# Patient Record
Sex: Female | Born: 1995 | Race: White | Hispanic: No | Marital: Married | Smoking: Never smoker
Health system: Southern US, Community
[De-identification: ages and names within clinical notes are randomized; demographics above are authoritative.]

## PROBLEM LIST (undated history)

## (undated) DIAGNOSIS — J45909 Unspecified asthma, uncomplicated: Secondary | ICD-10-CM

## (undated) DIAGNOSIS — R519 Headache, unspecified: Secondary | ICD-10-CM

## (undated) DIAGNOSIS — R51 Headache: Secondary | ICD-10-CM

## (undated) DIAGNOSIS — T7840XA Allergy, unspecified, initial encounter: Secondary | ICD-10-CM

## (undated) DIAGNOSIS — B019 Varicella without complication: Secondary | ICD-10-CM

## (undated) HISTORY — DX: Allergy, unspecified, initial encounter: T78.40XA

## (undated) HISTORY — DX: Varicella without complication: B01.9

## (undated) HISTORY — DX: Headache: R51

## (undated) HISTORY — DX: Unspecified asthma, uncomplicated: J45.909

## (undated) HISTORY — DX: Headache, unspecified: R51.9

## (undated) HISTORY — PX: NO PAST SURGERIES: SHX2092

---
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
## 2017-07-27 LAB — HM PAP SMEAR: HM Pap smear: NEGATIVE

## 2018-09-30 ENCOUNTER — Encounter: Payer: Self-pay | Admitting: Primary Care

## 2020-10-03 LAB — CBC AND DIFFERENTIAL: Hemoglobin: 12.6 (ref 12.0–16.0)

## 2020-10-06 LAB — HM PAP SMEAR

## 2020-11-20 ENCOUNTER — Encounter: Payer: Self-pay | Admitting: Gynecology

## 2020-11-26 ENCOUNTER — Encounter: Payer: Self-pay | Admitting: Primary Care

## 2021-11-03 ENCOUNTER — Other Ambulatory Visit: Payer: Self-pay | Admitting: Physician Assistant

## 2021-11-03 DIAGNOSIS — R7989 Other specified abnormal findings of blood chemistry: Secondary | ICD-10-CM

## 2021-11-03 DIAGNOSIS — R112 Nausea with vomiting, unspecified: Secondary | ICD-10-CM

## 2021-11-05 ENCOUNTER — Ambulatory Visit
Admission: RE | Admit: 2021-11-05 | Discharge: 2021-11-05 | Disposition: A | Discharge status: Home / Self Care | Payer: No Typology Code available for payment source | Source: Ambulatory Visit | Attending: Physician Assistant | Admitting: Physician Assistant

## 2021-11-05 DIAGNOSIS — R112 Nausea with vomiting, unspecified: Secondary | ICD-10-CM

## 2021-11-05 DIAGNOSIS — R7989 Other specified abnormal findings of blood chemistry: Secondary | ICD-10-CM

## 2023-04-05 LAB — OB RESULTS CONSOLE RUBELLA ANTIBODY, IGM: Rubella: IMMUNE

## 2023-04-05 LAB — OB RESULTS CONSOLE GC/CHLAMYDIA
Chlamydia: NEGATIVE
Neisseria Gonorrhea: NEGATIVE

## 2023-04-05 LAB — OB RESULTS CONSOLE RPR: RPR: NONREACTIVE

## 2023-04-05 LAB — OB RESULTS CONSOLE HEPATITIS B SURFACE ANTIGEN: Hepatitis B Surface Ag: NEGATIVE

## 2023-04-05 LAB — HEPATITIS C ANTIBODY: HCV Ab: NEGATIVE

## 2023-04-05 LAB — OB RESULTS CONSOLE HIV ANTIBODY (ROUTINE TESTING): HIV: NONREACTIVE

## 2023-04-14 NOTE — L&D Delivery Note (Addendum)
 DELIVERY NOTE  Pt complete and at +2 station with urge to push. Epidural controlling pain. Pt pushed and delivered a viable female infant in LOA position. Anterior and posterior shoulders spontaneously delivered with next two pushes; body easily followed next. Infant placed on mothers abdomen and bulb suction of mouth and nose performed. Cord was then clamped and cut by MD. Cord blood obtained, 3VC. Initial weak cry and poor color so baby stimulated at warmer and had a quick improvement in both; brought back to mother. Placenta then delivered at 0349 intact. Fundal massage performed and pitocin  per protocol. Fundus firm. The following lacerations were noted: NONE, EBL 250cc. Mother and baby stable. Counts correct   Infant time: 26 Gender: female, DESIRES CIRC Placenta time: 0349 Apgars: 7/9 Weight: pending skin-to-skin  Continue magnesium  sulfate for next 24hrs. Labetalol  200mg  BID started given BP and elevated HR

## 2023-10-25 ENCOUNTER — Encounter (HOSPITAL_COMMUNITY): Payer: Self-pay | Admitting: Obstetrics and Gynecology

## 2023-10-25 ENCOUNTER — Other Ambulatory Visit: Payer: Self-pay

## 2023-10-25 ENCOUNTER — Inpatient Hospital Stay (HOSPITAL_COMMUNITY)
Admission: RE | Admit: 2023-10-25 | Discharge: 2023-10-29 | DRG: 807 | Disposition: A | Discharge status: Home / Self Care | Attending: Obstetrics and Gynecology | Admitting: Obstetrics and Gynecology

## 2023-10-25 DIAGNOSIS — R000 Tachycardia, unspecified: Secondary | ICD-10-CM | POA: Diagnosis present

## 2023-10-25 DIAGNOSIS — O1414 Severe pre-eclampsia complicating childbirth: Secondary | ICD-10-CM | POA: Diagnosis present

## 2023-10-25 DIAGNOSIS — E876 Hypokalemia: Secondary | ICD-10-CM | POA: Diagnosis present

## 2023-10-25 DIAGNOSIS — Z8249 Family history of ischemic heart disease and other diseases of the circulatory system: Secondary | ICD-10-CM

## 2023-10-25 DIAGNOSIS — Z3A37 37 weeks gestation of pregnancy: Secondary | ICD-10-CM

## 2023-10-25 DIAGNOSIS — O99284 Endocrine, nutritional and metabolic diseases complicating childbirth: Secondary | ICD-10-CM | POA: Diagnosis present

## 2023-10-25 DIAGNOSIS — O42913 Preterm premature rupture of membranes, unspecified as to length of time between rupture and onset of labor, third trimester: Principal | ICD-10-CM | POA: Diagnosis present

## 2023-10-25 DIAGNOSIS — Z3490 Encounter for supervision of normal pregnancy, unspecified, unspecified trimester: Principal | ICD-10-CM

## 2023-10-25 DIAGNOSIS — Z833 Family history of diabetes mellitus: Secondary | ICD-10-CM | POA: Diagnosis not present

## 2023-10-25 DIAGNOSIS — O99892 Other specified diseases and conditions complicating childbirth: Secondary | ICD-10-CM | POA: Diagnosis present

## 2023-10-25 LAB — COMPREHENSIVE METABOLIC PANEL WITH GFR
ALT: 14 U/L (ref 0–44)
AST: 21 U/L (ref 15–41)
Albumin: 2.9 g/dL — ABNORMAL LOW (ref 3.5–5.0)
Alkaline Phosphatase: 129 U/L — ABNORMAL HIGH (ref 38–126)
Anion gap: 12 (ref 5–15)
BUN: 6 mg/dL (ref 6–20)
CO2: 20 mmol/L — ABNORMAL LOW (ref 22–32)
Calcium: 9 mg/dL (ref 8.9–10.3)
Chloride: 106 mmol/L (ref 98–111)
Creatinine, Ser: 0.54 mg/dL (ref 0.44–1.00)
GFR, Estimated: >60 mL/min (ref >60)
Glucose, Bld: 77 mg/dL (ref 70–99)
Potassium: 2.9 mmol/L — ABNORMAL LOW (ref 3.5–5.1)
Sodium: 138 mmol/L (ref 135–145)
Total Bilirubin: 0.4 mg/dL (ref 0.0–1.2)
Total Protein: 6.6 g/dL (ref 6.5–8.1)

## 2023-10-25 LAB — TYPE AND SCREEN
ABO/RH(D): AB POS
Antibody Screen: NEGATIVE

## 2023-10-25 LAB — CBC
HCT: 31.7 % — ABNORMAL LOW (ref 36.0–46.0)
Hemoglobin: 10.9 g/dL — ABNORMAL LOW (ref 12.0–15.0)
MCH: 29.1 pg (ref 26.0–34.0)
MCHC: 34.4 g/dL (ref 30.0–36.0)
MCV: 84.8 fL (ref 80.0–100.0)
Platelets: 278 K/uL (ref 150–400)
RBC: 3.74 MIL/uL — ABNORMAL LOW (ref 3.87–5.11)
RDW: 13.2 % (ref 11.5–15.5)
WBC: 12.2 K/uL — ABNORMAL HIGH (ref 4.0–10.5)
nRBC: 0 % (ref 0.0–0.2)

## 2023-10-25 LAB — PROTEIN / CREATININE RATIO, URINE
Creatinine, Urine: 29 mg/dL
Total Protein, Urine: <6 mg/dL

## 2023-10-25 MED ORDER — FENTANYL-BUPIVACAINE-NACL 0.5-0.125-0.9 MG/250ML-% EP SOLN
12.0000 mL/h | EPIDURAL | Status: DC | PRN
  Administered 2023-10-26: 12 mL/h via EPIDURAL
  Filled 2023-10-25: qty 250

## 2023-10-25 MED ORDER — TERBUTALINE SULFATE 1 MG/ML IJ SOLN: 0.2500 mg | Freq: Once | INTRAMUSCULAR | Status: DC | PRN

## 2023-10-25 MED ORDER — DIPHENHYDRAMINE HCL 50 MG/ML IJ SOLN: 12.5000 mg | INTRAMUSCULAR | Status: DC | PRN

## 2023-10-25 MED ORDER — LACTATED RINGERS IV SOLN: 500.0000 mL | Freq: Once | INTRAVENOUS | Status: DC

## 2023-10-25 MED ORDER — OXYTOCIN-SODIUM CHLORIDE 30-0.9 UT/500ML-% IV SOLN
1.0000 m[IU]/min | INTRAVENOUS | Status: DC
  Administered 2023-10-25: 2 m[IU]/min via INTRAVENOUS
  Filled 2023-10-25: qty 500

## 2023-10-25 MED ORDER — MAGNESIUM SULFATE 40 GM/1000ML IV SOLN
2.0000 g/h | INTRAVENOUS | Status: AC
  Administered 2023-10-25 – 2023-10-26 (×2): 2 g/h via INTRAVENOUS
  Filled 2023-10-25 (×2): qty 1000

## 2023-10-25 MED ORDER — LABETALOL HCL 5 MG/ML IV SOLN: 20.0000 mg | INTRAVENOUS | Status: DC | PRN

## 2023-10-25 MED ORDER — EPHEDRINE 5 MG/ML INJ: 10.0000 mg | INTRAVENOUS | Status: DC | PRN

## 2023-10-25 MED ORDER — SODIUM CHLORIDE 0.9 % IV SOLN: INTRAVENOUS | Status: AC

## 2023-10-25 MED ORDER — LABETALOL HCL 5 MG/ML IV SOLN: 80.0000 mg | INTRAVENOUS | Status: DC | PRN

## 2023-10-25 MED ORDER — ACETAMINOPHEN 325 MG PO TABS: 650.0000 mg | ORAL_TABLET | ORAL | Status: DC | PRN

## 2023-10-25 MED ORDER — FENTANYL CITRATE (PF) 100 MCG/2ML IJ SOLN
INTRAMUSCULAR | Status: AC
  Filled 2023-10-25: qty 2

## 2023-10-25 MED ORDER — ONDANSETRON HCL 4 MG/2ML IJ SOLN
4.0000 mg | Freq: Four times a day (QID) | INTRAMUSCULAR | Status: DC | PRN
Start: 2023-10-25 — End: 2023-10-26
  Administered 2023-10-26: 4 mg via INTRAVENOUS
  Filled 2023-10-25: qty 2

## 2023-10-25 MED ORDER — LACTATED RINGERS IV SOLN: INTRAVENOUS | Status: DC

## 2023-10-25 MED ORDER — OXYTOCIN-SODIUM CHLORIDE 30-0.9 UT/500ML-% IV SOLN
2.5000 [IU]/h | INTRAVENOUS | Status: DC
  Administered 2023-10-26: 2.5 [IU]/h via INTRAVENOUS

## 2023-10-25 MED ORDER — PHENYLEPHRINE 80 MCG/ML (10ML) SYRINGE FOR IV PUSH (FOR BLOOD PRESSURE SUPPORT): 80.0000 ug | PREFILLED_SYRINGE | INTRAVENOUS | Status: DC | PRN

## 2023-10-25 MED ORDER — LIDOCAINE HCL (PF) 1 % IJ SOLN: 30.0000 mL | INTRAMUSCULAR | Status: DC | PRN

## 2023-10-25 MED ORDER — MAGNESIUM SULFATE BOLUS VIA INFUSION
4.0000 g | Freq: Once | INTRAVENOUS | Status: AC
  Administered 2023-10-25: 4 g via INTRAVENOUS
  Filled 2023-10-25: qty 1000

## 2023-10-25 MED ORDER — POTASSIUM CHLORIDE CRYS ER 20 MEQ PO TBCR
10.0000 meq | EXTENDED_RELEASE_TABLET | Freq: Two times a day (BID) | ORAL | Status: DC
  Administered 2023-10-25: 10 meq via ORAL
  Filled 2023-10-25 (×2): qty 1

## 2023-10-25 MED ORDER — SOD CITRATE-CITRIC ACID 500-334 MG/5ML PO SOLN
30.0000 mL | ORAL | Status: DC | PRN
Start: 2023-10-25 — End: 2023-10-26

## 2023-10-25 MED ORDER — HYDRALAZINE HCL 20 MG/ML IJ SOLN: 10.0000 mg | INTRAMUSCULAR | Status: DC | PRN

## 2023-10-25 MED ORDER — LABETALOL HCL 5 MG/ML IV SOLN: 40.0000 mg | INTRAVENOUS | Status: DC | PRN

## 2023-10-25 MED ORDER — PHENYLEPHRINE 80 MCG/ML (10ML) SYRINGE FOR IV PUSH (FOR BLOOD PRESSURE SUPPORT)
80.0000 ug | PREFILLED_SYRINGE | INTRAVENOUS | Status: DC | PRN
  Filled 2023-10-25: qty 10

## 2023-10-25 MED ORDER — LACTATED RINGERS IV SOLN
500.0000 mL | INTRAVENOUS | Status: DC | PRN
  Administered 2023-10-26: 500 mL via INTRAVENOUS

## 2023-10-25 MED ORDER — OXYTOCIN BOLUS FROM INFUSION
333.0000 mL | Freq: Once | INTRAVENOUS | Status: AC
Start: 2023-10-25 — End: 2023-10-26
  Administered 2023-10-26: 333 mL via INTRAVENOUS

## 2023-10-25 MED ORDER — FENTANYL CITRATE (PF) 100 MCG/2ML IJ SOLN
100.0000 ug | INTRAMUSCULAR | Status: DC | PRN
  Administered 2023-10-25: 100 ug via INTRAVENOUS

## 2023-10-25 NOTE — Progress Notes (Signed)
 Patient now with persistent severe range blood pressure.s PreE labs WNL save for hypokalemia at 2.9. Now meets criteria for PreE w/ SF being severe range BP.  BP (!) 160/103 (BP Location: Right Arm)   Pulse (!) 109   Temp 98.3 F (36.8 C) (Oral)   Resp 16   Ht 5' 5 (1.651 m)   Wt 95.9 kg   LMP 02/09/2023   SpO2 98%   BMI 35.18 kg/m  Denies PreE symptoms. CE 4/90/-1. Very small forebag noted, ruptured with clear scant fluid return. Cat 1 tracing, pitocin  at 6 mU/min, TOCO q2-5min.  Being magnesium  sulfate for seizure ppx at this time, labetalol  protocol in place

## 2023-10-25 NOTE — H&P (Signed)
 Brenda Walter is a 28 y.o. female presenting from clinic. Complained fo slow elakage of clear fluid this afternoon, mild cramping but +FM, denies VB. Sister is US  tech and stated concern for oligohydramnios. In office today, no palpable bag, nitrazine neg but AFI 1cm. Sent over with presumed ROM. GBS neg, otherwise uncomplicated pregnancy  OB History     Gravida  1   Para      Term      Preterm      AB      Living         SAB      IAB      Ectopic      Multiple      Live Births             Past Medical History:  Diagnosis Date   Allergy    Asthma    Chicken pox    Headache    Past Surgical History:  Procedure Laterality Date   NO PAST SURGERIES     Family History: family history includes Alzheimer's disease in her paternal grandfather; Alzheimer's disease (age of onset: 12) in her maternal grandmother; Cancer in her mother; Congestive Heart Failure in her father; Diabetes in her maternal grandfather and mother; Glaucoma in her maternal grandfather; Heart disease in her father and maternal grandfather; Hypertension in her maternal grandfather. Social History:  reports that she has never smoked. She does not have any smokeless tobacco history on file. She reports current alcohol use. She reports that she does not use drugs.     Maternal Diabetes: No1hr 134 Genetic Screening: Normal Maternal Ultrasounds/Referrals: Normal Fetal Ultrasounds or other Referrals:  None Maternal Substance Abuse:  No Significant Maternal Medications:  None Significant Maternal Lab Results:  Group B Strep negative Number of Prenatal Visits:greater than 3 verified prenatal visits Maternal Vaccinations:TDap Other Comments:  None  Review of Systems  Constitutional:  Negative for chills and fever.  Respiratory:  Negative for shortness of breath.   Cardiovascular:  Negative for chest pain, palpitations and leg swelling.  Gastrointestinal:  Negative for abdominal pain, nausea and  vomiting.  Neurological:  Negative for dizziness, weakness and headaches.  Psychiatric/Behavioral:  Negative for suicidal ideas.    Maternal Medical History:  Reason for admission: Rupture of membranes.  Nausea.  Contractions: Frequency: irregular.   Fetal activity: Perceived fetal activity is normal.   Prenatal complications: PIH.   Prenatal Complications - Diabetes: none.   Dilation: 3 Effacement (%): 80 Station: -1 Exam by:: Palestine Mosco Blood pressure (!) 158/91, pulse (!) 109, temperature 98.3 F (36.8 C), temperature source Oral, resp. rate 16, SpO2 98%. Exam Physical Exam Constitutional:      General: She is not in acute distress.    Appearance: She is well-developed.  HENT:     Head: Normocephalic and atraumatic.  Eyes:     Pupils: Pupils are equal, round, and reactive to light.  Cardiovascular:     Rate and Rhythm: Normal rate and regular rhythm.     Heart sounds: No murmur heard.    No gallop.  Abdominal:     Tenderness: There is no abdominal tenderness. There is no guarding or rebound.  Genitourinary:    Vagina: Normal.  Musculoskeletal:        General: Normal range of motion.     Cervical back: Normal range of motion and neck supple.  Skin:    General: Skin is warm and dry.  Neurological:     Mental  Status: She is alert and oriented to person, place, and time.     Prenatal labs: ABO, Rh:  AB pos Antibody:   neg Rubella:  imm RPR:   nr HBsAg:   neg HIV:   nr GBS:   neg Cat 1 tracing currently TOCO q3-31m  Assessment/Plan: This is a 28yo G1 @ 36 6/7 by LMP c/w 1st trim TVUS admitted for IOL for PROM. GBS neg. Of note, elevated BP x2 on admission but pt asymptomatic. PreE labs ordered. CE 3/80/-1. Pitocin  per protocol. Of note, after being placed on monitor, contraction x1 with decel with SRTB. Cat 1 since. Continue to monitor. Planning epidural   Brenda Walter 10/25/2023, 6:02 PM

## 2023-10-26 ENCOUNTER — Encounter (HOSPITAL_COMMUNITY): Payer: Self-pay | Admitting: Obstetrics and Gynecology

## 2023-10-26 ENCOUNTER — Inpatient Hospital Stay (HOSPITAL_COMMUNITY): Admitting: Anesthesiology

## 2023-10-26 LAB — CBC WITH DIFFERENTIAL/PLATELET
Abs Immature Granulocytes: 0.2 K/uL — ABNORMAL HIGH (ref 0.00–0.07)
Basophils Absolute: 0.1 K/uL (ref 0.0–0.1)
Basophils Relative: 0 %
Eosinophils Absolute: 0.1 K/uL (ref 0.0–0.5)
Eosinophils Relative: 1 %
HCT: 33.9 % — ABNORMAL LOW (ref 36.0–46.0)
Hemoglobin: 11.2 g/dL — ABNORMAL LOW (ref 12.0–15.0)
Immature Granulocytes: 1 %
Lymphocytes Relative: 17 %
Lymphs Abs: 2.7 K/uL (ref 0.7–4.0)
MCH: 28.1 pg (ref 26.0–34.0)
MCHC: 33 g/dL (ref 30.0–36.0)
MCV: 85.2 fL (ref 80.0–100.0)
Monocytes Absolute: 0.9 K/uL (ref 0.1–1.0)
Monocytes Relative: 6 %
Neutro Abs: 11.8 K/uL — ABNORMAL HIGH (ref 1.7–7.7)
Neutrophils Relative %: 75 %
Platelets: 265 K/uL (ref 150–400)
RBC: 3.98 MIL/uL (ref 3.87–5.11)
RDW: 13.2 % (ref 11.5–15.5)
WBC: 15.7 K/uL — ABNORMAL HIGH (ref 4.0–10.5)
nRBC: 0 % (ref 0.0–0.2)

## 2023-10-26 LAB — COMPREHENSIVE METABOLIC PANEL WITH GFR
ALT: 14 U/L (ref 0–44)
AST: 27 U/L (ref 15–41)
Albumin: 2.6 g/dL — ABNORMAL LOW (ref 3.5–5.0)
Alkaline Phosphatase: 122 U/L (ref 38–126)
Anion gap: 10 (ref 5–15)
BUN: <5 mg/dL — ABNORMAL LOW (ref 6–20)
CO2: 22 mmol/L (ref 22–32)
Calcium: 7.6 mg/dL — ABNORMAL LOW (ref 8.9–10.3)
Chloride: 106 mmol/L (ref 98–111)
Creatinine, Ser: 0.57 mg/dL (ref 0.44–1.00)
GFR, Estimated: >60 mL/min (ref >60)
Glucose, Bld: 114 mg/dL — ABNORMAL HIGH (ref 70–99)
Potassium: 2.8 mmol/L — ABNORMAL LOW (ref 3.5–5.1)
Sodium: 138 mmol/L (ref 135–145)
Total Bilirubin: 0.6 mg/dL (ref 0.0–1.2)
Total Protein: 6.1 g/dL — ABNORMAL LOW (ref 6.5–8.1)

## 2023-10-26 LAB — CBC
HCT: 31.5 % — ABNORMAL LOW (ref 36.0–46.0)
Hemoglobin: 10.4 g/dL — ABNORMAL LOW (ref 12.0–15.0)
MCH: 28 pg (ref 26.0–34.0)
MCHC: 33 g/dL (ref 30.0–36.0)
MCV: 84.7 fL (ref 80.0–100.0)
Platelets: 247 K/uL (ref 150–400)
RBC: 3.72 MIL/uL — ABNORMAL LOW (ref 3.87–5.11)
RDW: 13.2 % (ref 11.5–15.5)
WBC: 18.3 K/uL — ABNORMAL HIGH (ref 4.0–10.5)
nRBC: 0 % (ref 0.0–0.2)

## 2023-10-26 LAB — RPR: RPR Ser Ql: NONREACTIVE

## 2023-10-26 MED ORDER — TETANUS-DIPHTH-ACELL PERTUSSIS 5-2.5-18.5 LF-MCG/0.5 IM SUSY: 0.5000 mL | PREFILLED_SYRINGE | Freq: Once | INTRAMUSCULAR | Status: DC

## 2023-10-26 MED ORDER — DIPHENHYDRAMINE HCL 25 MG PO CAPS: 25.0000 mg | ORAL_CAPSULE | Freq: Four times a day (QID) | ORAL | Status: DC | PRN

## 2023-10-26 MED ORDER — ONDANSETRON HCL 4 MG PO TABS
4.0000 mg | ORAL_TABLET | ORAL | Status: DC | PRN
  Administered 2023-10-28 – 2023-10-29 (×2): 4 mg via ORAL
  Filled 2023-10-26 (×3): qty 1

## 2023-10-26 MED ORDER — WITCH HAZEL-GLYCERIN EX PADS: 1.0000 | MEDICATED_PAD | CUTANEOUS | Status: DC | PRN

## 2023-10-26 MED ORDER — POTASSIUM CHLORIDE 20 MEQ PO PACK
20.0000 meq | PACK | Freq: Two times a day (BID) | ORAL | Status: DC
  Filled 2023-10-26: qty 1

## 2023-10-26 MED ORDER — POTASSIUM CHLORIDE CRYS ER 20 MEQ PO TBCR
20.0000 meq | EXTENDED_RELEASE_TABLET | Freq: Two times a day (BID) | ORAL | Status: DC
  Administered 2023-10-26 (×2): 20 meq via ORAL
  Filled 2023-10-26 (×2): qty 1

## 2023-10-26 MED ORDER — ONDANSETRON HCL 4 MG/2ML IJ SOLN: 4.0000 mg | INTRAMUSCULAR | Status: DC | PRN

## 2023-10-26 MED ORDER — COCONUT OIL OIL
1.0000 | TOPICAL_OIL | Status: DC | PRN
Start: 2023-10-26 — End: 2023-10-29
  Administered 2023-10-26: 1 via TOPICAL

## 2023-10-26 MED ORDER — BENZOCAINE-MENTHOL 20-0.5 % EX AERO
1.0000 | INHALATION_SPRAY | CUTANEOUS | Status: DC | PRN
Start: 2023-10-26 — End: 2023-10-29
  Administered 2023-10-26: 1 via TOPICAL
  Filled 2023-10-26: qty 56

## 2023-10-26 MED ORDER — LABETALOL HCL 200 MG PO TABS
200.0000 mg | ORAL_TABLET | Freq: Two times a day (BID) | ORAL | Status: DC
  Administered 2023-10-26: 200 mg via ORAL
  Filled 2023-10-26: qty 1

## 2023-10-26 MED ORDER — LABETALOL HCL 200 MG PO TABS
200.0000 mg | ORAL_TABLET | Freq: Three times a day (TID) | ORAL | Status: DC
  Administered 2023-10-26 – 2023-10-29 (×9): 200 mg via ORAL
  Filled 2023-10-26 (×9): qty 1

## 2023-10-26 MED ORDER — PRENATAL MULTIVITAMIN CH
1.0000 | ORAL_TABLET | Freq: Every day | ORAL | Status: DC
  Administered 2023-10-26 – 2023-10-29 (×4): 1 via ORAL
  Filled 2023-10-26 (×5): qty 1

## 2023-10-26 MED ORDER — SENNOSIDES-DOCUSATE SODIUM 8.6-50 MG PO TABS
2.0000 | ORAL_TABLET | Freq: Every day | ORAL | Status: DC
  Administered 2023-10-27: 2 via ORAL
  Filled 2023-10-26 (×3): qty 2

## 2023-10-26 MED ORDER — ZOLPIDEM TARTRATE 5 MG PO TABS: 5.0000 mg | ORAL_TABLET | Freq: Every evening | ORAL | Status: DC | PRN

## 2023-10-26 MED ORDER — DIBUCAINE (PERIANAL) 1 % EX OINT: 1.0000 | TOPICAL_OINTMENT | CUTANEOUS | Status: DC | PRN

## 2023-10-26 MED ORDER — IBUPROFEN 600 MG PO TABS
600.0000 mg | ORAL_TABLET | Freq: Four times a day (QID) | ORAL | Status: DC
  Administered 2023-10-26 – 2023-10-29 (×2): 600 mg via ORAL
  Filled 2023-10-26 (×10): qty 1

## 2023-10-26 MED ORDER — LIDOCAINE HCL (PF) 1 % IJ SOLN
INTRAMUSCULAR | Status: DC | PRN
Start: 2023-10-26 — End: 2023-10-26
  Administered 2023-10-26: 10 mL via EPIDURAL

## 2023-10-26 MED ORDER — ACETAMINOPHEN 325 MG PO TABS
650.0000 mg | ORAL_TABLET | ORAL | Status: DC | PRN
  Filled 2023-10-26: qty 2

## 2023-10-26 MED ORDER — SIMETHICONE 80 MG PO CHEW: 80.0000 mg | CHEWABLE_TABLET | ORAL | Status: DC | PRN

## 2023-10-26 NOTE — Lactation Note (Addendum)
 This note was copied from a baby's chart. Lactation Consultation Note  Patient Name: Brenda Walter Date: 10/26/2023 Age:27 hours Reason for consult: Initial assessment;1st time breastfeeding;Early term 37-38.6wks  P1, Baby 37 weeks.  Mother states she would like to breastfeed but is open to however baby needs to be fed. Mother on magnesium  sulfate and is sleepy.  Baby fussy.   Reviewed hand expression with drops expressed. Had father of baby check diaper to wake baby and open outfit so baby is skin to skin with mother during feeding. Assisted with latching in football hold. After a few attempts baby started sucking off and on for a total feeding time of 5 min.  RN set up DEBP. LC reviewed pump and put 17 mm flange insert in 24 mm flange. Recommend pumped q 3 hours for 15 min.    Mother may need assistance with feeding later today. Feed on demand with cues.  Goal 8-12+ times per day after first 24 hrs.  Place baby STS if not cueing.  Gave baby drops on spoon and recommend giving colostrum to baby q 2-3 hours until baby is latching.  Maternal Data Has patient been taught Hand Expression?: Yes Does the patient have breastfeeding experience prior to this delivery?: No  Feeding Mother's Current Feeding Choice: Breast Milk and Formula  LATCH Score Latch: Repeated attempts needed to sustain latch, nipple held in mouth throughout feeding, stimulation needed to elicit sucking reflex.  Audible Swallowing: A few with stimulation  Type of Nipple: Everted at rest and after stimulation  Comfort (Breast/Nipple): Soft / non-tender  Hold (Positioning): Assistance needed to correctly position infant at breast and maintain latch.  LATCH Score: 7   Lactation Tools Discussed/Used Tools: Flanges;Pump Flange Size:  (17 insert) Breast pump type: Double-Electric Breast Pump Pump Education: Milk Storage;Setup, frequency, and cleaning Reason for Pumping: stimulation and  supplementation Pumping frequency:  (q 3 hours for 15 min)  Interventions Interventions: Breast feeding basics reviewed;Assisted with latch;Skin to skin;Hand express;Adjust position;Support pillows;DEBP;Education;LC Services brochure;CDC milk storage guidelines  Discharge Pump: Personal;DEBP (Spectra )  Consult Status Consult Status: Follow-up Date: 10/26/23 Follow-up type: In-patient   Shannon Levorn Lemme  RN, IBCLC 10/26/2023, 10:39 AM

## 2023-10-26 NOTE — Anesthesia Postprocedure Evaluation (Signed)
 Anesthesia Post Note  Patient: Brenda Walter  Procedure(s) Performed: AN AD HOC LABOR EPIDURAL     Patient location during evaluation: Mother Baby Anesthesia Type: Epidural Level of consciousness: awake and alert Pain management: pain level controlled Vital Signs Assessment: post-procedure vital signs reviewed and stable Respiratory status: spontaneous breathing, nonlabored ventilation and respiratory function stable Cardiovascular status: stable Postop Assessment: no headache, no backache and epidural receding Anesthetic complications: no   No notable events documented.  Last Vitals:  Vitals:   10/26/23 1158 10/26/23 1338  BP: 119/79   Pulse: 93   Resp: 16 18  Temp: 36.5 C   SpO2: 99%     Last Pain:  Vitals:   10/26/23 1338  TempSrc:   PainSc: 0-No pain   Pain Goal:                   Deanza Upperman

## 2023-10-26 NOTE — Progress Notes (Addendum)
 Post Partum Day 0 Subjective: Patient is doing okay.  Pain is controlled.  Lochia minimal.     Objective: Patient Vitals for the past 24 hrs:  BP Temp Temp src Pulse Resp SpO2 Height Weight  10/26/23 0930 -- -- -- -- 16 -- -- --  10/26/23 0850 -- -- -- -- 16 -- -- --  10/26/23 0732 (!) 155/84 (!) 97.5 F (36.4 C) Oral (!) 103 16 -- -- --  10/26/23 0629 (!) 157/93 98 F (36.7 C) Oral (!) 113 18 98 % -- --  10/26/23 0601 (!) 151/95 -- -- (!) 109 -- -- -- --  10/26/23 0515 (!) 150/88 98.2 F (36.8 C) Oral (!) 107 18 98 % -- --  10/26/23 0446 (!) 144/87 -- -- (!) 102 -- -- -- --  10/26/23 0431 (!) 149/92 -- -- (!) 101 -- -- -- --  10/26/23 0415 (!) 156/88 98.2 F (36.8 C) Oral (!) 112 -- 96 % -- --  10/26/23 0403 (!) 163/90 -- -- (!) 116 -- -- -- --  10/26/23 0326 -- -- -- -- -- 98 % -- --  10/26/23 0325 -- -- -- -- -- 98 % -- --  10/26/23 0320 -- -- -- -- -- 99 % -- --  10/26/23 0310 -- -- -- -- -- 99 % -- --  10/26/23 0250 -- -- -- -- -- 99 % -- --  10/26/23 0240 -- -- -- -- -- 99 % -- --  10/26/23 0230 -- -- -- -- -- 100 % -- --  10/26/23 0200 (!) 148/72 -- -- (!) 122 15 98 % -- --  10/26/23 0134 -- 97.9 F (36.6 C) Axillary -- -- -- -- --  10/26/23 0130 (!) 158/79 -- -- (!) 115 -- 98 % -- --  10/26/23 0123 -- 98.1 F (36.7 C) Oral -- -- -- -- --  10/26/23 0110 (!) 160/101 -- -- (!) 123 -- 97 % -- --  10/26/23 0100 (!) 175/102 -- -- (!) 127 14 98 % -- --  10/26/23 0055 (!) 146/96 -- -- (!) 114 -- 98 % -- --  10/26/23 0050 (!) 139/93 -- -- (!) 115 -- 98 % -- --  10/26/23 0045 (!) 159/90 -- -- (!) 125 -- 99 % -- --  10/26/23 0043 (!) 162/103 -- -- (!) 119 -- -- -- --  10/26/23 0034 (!) 164/105 -- -- (!) 122 -- 100 % -- --  10/26/23 0030 (!) 147/94 -- -- (!) 121 -- 100 % -- --  10/26/23 0015 127/81 -- -- (!) 115 -- -- -- --  10/26/23 0000 (!) 130/108 -- -- (!) 113 15 100 % -- --  10/25/23 2330 (!) 148/80 -- -- (!) 110 -- -- -- --  10/25/23 2300 (!) 161/89 -- -- (!) 115 16 --  -- --  10/25/23 2230 135/76 -- -- (!) 112 -- -- -- --  10/25/23 2200 (!) 147/83 -- -- (!) 113 16 -- -- --  10/25/23 2133 (!) 151/77 -- -- (!) 119 -- -- -- --  10/25/23 2126 -- 98.5 F (36.9 C) Axillary -- 14 -- -- --  10/25/23 2121 -- 98 F (36.7 C) Oral -- -- -- -- --  10/25/23 2103 (!) 160/103 -- -- (!) 109 -- -- -- --  10/25/23 2100 (!) 173/96 -- -- (!) 107 -- -- -- --  10/25/23 2032 (!) 152/99 -- -- (!) 107 -- -- -- --  10/25/23 2014 (!) 166/96 -- -- (!) 108 -- -- -- --  10/25/23 1927 (!) 157/96 -- -- (!) 108 -- -- -- --  10/25/23 1900 (!) 153/93 -- -- (!) 110 16 -- -- --  10/25/23 1836 (!) 150/97 -- -- (!) 112 14 -- -- --  10/25/23 1812 (!) 156/94 -- -- (!) 112 16 -- -- --  10/25/23 1740 -- -- -- -- -- 98 % -- --  10/25/23 1735 -- -- -- -- -- 97 % -- --  10/25/23 1724 (!) 158/91 -- -- (!) 109 16 -- -- --  10/25/23 1721 -- -- -- -- -- -- 5' 5 (1.651 m) 95.9 kg  10/25/23 1711 (!) 163/97 98.3 F (36.8 C) Oral (!) 111 16 -- -- --    Physical Exam:  General: alert, cooperative, and no distress Lochia: appropriate Uterine Fundus: firm DVT Evaluation: No evidence of DVT seen on physical exam.  Recent Labs    10/25/23 1746 10/25/23 2350  WBC 12.2* 15.7*  HGB 10.9* 11.2*  HCT 31.7* 33.9*  PLT 278 265    Recent Labs    10/25/23 1745  NA 138  K 2.9*  CL 106  BUN 6  CREATININE 0.54  GLUCOSE 77  BILITOT 0.4  ALT 14  AST 21  ALKPHOS 129*  PROT 6.6  ALBUMIN 2.9*    Recent Labs    10/25/23 1745  CALCIUM 9.0    No results for input(s): PROTIME, APTT, INR in the last 72 hours.  No results for input(s): PROTIME, APTT, INR, FIBRINOGEN in the last 72 hours. Assessment/Plan: Brenda Walter 28 y.o. G1P1001 PPD#0 sp SVD 1. PPC: routine PP care, AM CBC pending 2. PreE with SF: continue magnesium  x 24 hours postpartum. She was started on labetalol  200mg  BID this AM, will monitor blood pressures and adjust as needed.  3. Hypokalemia: on PO repletion,  repeat K pending 4. Desires circumcision, plan for PPD 1 or 2    LOS: 1 day   Brenda Walter 10/26/2023, 10:25 AM

## 2023-10-26 NOTE — Progress Notes (Signed)
 Labor Note  S: feeling more comfortable s/p epidural. Denies PreE symptoms but notes LE edema  O: BP (!) 146/96   Pulse (!) 114   Temp 98.5 F (36.9 C) (Axillary)   Resp 15   Ht 5' 5 (1.651 m)   Wt 95.9 kg   LMP 02/09/2023   SpO2 98%   BMI 35.18 kg/m  CE: IUPC placed, CE 7/90/-1 FHR: Baseline 130, +accels, -decels, mod variability., Tracing broken while positioning patient for IUPC but able to retrun to external monitoring for FHR TOCO q2-13m, pitocin  at 14mU/min  2+ pedal edema BL No clonus noted  A/P: This is a 28 y.o. G1P0 at [redacted]w[redacted]d  admitted for IOL for PROM, met PReE w/ SF diagnosis via persistent severe range BP. Incidental hypokalemia on admission FWB: cat 1 tracing MWB: s/p epidural, on magnesium  sulfate, asx from PreE Labor course: Now in active labor s/p AROM of forebag and pitocin . Anticipate SVD  Anticipate SVD

## 2023-10-26 NOTE — Anesthesia Procedure Notes (Signed)
 Epidural Patient location during procedure: OB Start time: 10/26/2023 12:25 AM End time: 10/26/2023 12:35 AM  Staffing Anesthesiologist: Niels Marien CROME, MD Performed: anesthesiologist   Preanesthetic Checklist Completed: patient identified, IV checked, risks and benefits discussed, monitors and equipment checked, pre-op evaluation and timeout performed  Epidural Patient position: sitting Prep: DuraPrep and site prepped and draped Patient monitoring: continuous pulse ox, blood pressure, heart rate and cardiac monitor Approach: midline Location: L3-L4 Injection technique: LOR air  Needle:  Needle type: Tuohy  Needle gauge: 17 G Needle length: 9 cm Needle insertion depth: 5 cm Catheter type: closed end flexible Catheter size: 19 Gauge Catheter at skin depth: 10 cm Test dose: negative  Assessment Sensory level: T8 Events: blood not aspirated, no cerebrospinal fluid, injection not painful, no injection resistance, no paresthesia and negative IV test  Additional Notes Patient identified. Risks/Benefits/Options discussed with patient including but not limited to bleeding, infection, nerve damage, paralysis, failed block, incomplete pain control, headache, blood pressure changes, nausea, vomiting, reactions to medication both or allergic, itching and postpartum back pain. Confirmed with bedside nurse the patient's most recent platelet count. Confirmed with patient that they are not currently taking any anticoagulation, have any bleeding history or any family history of bleeding disorders. Patient expressed understanding and wished to proceed. All questions were answered. Sterile technique was used throughout the entire procedure. Please see nursing notes for vital signs. Test dose was given through epidural catheter and negative prior to continuing to dose epidural or start infusion. Warning signs of high block given to the patient including shortness of breath, tingling/numbness in  hands, complete motor block, or any concerning symptoms with instructions to call for help. Patient was given instructions on fall risk and not to get out of bed. All questions and concerns addressed with instructions to call with any issues or inadequate analgesia.  Reason for block:procedure for pain

## 2023-10-26 NOTE — Anesthesia Preprocedure Evaluation (Signed)
 Anesthesia Evaluation  Patient identified by MRN, date of birth, ID band Patient awake    Reviewed: Allergy & Precautions, NPO status , Patient's Chart, lab work & pertinent test results  Airway Mallampati: II  TM Distance: >3 FB Neck ROM: Full    Dental no notable dental hx.    Pulmonary asthma    Pulmonary exam normal breath sounds clear to auscultation       Cardiovascular hypertension (preE), Normal cardiovascular exam Rhythm:Regular Rate:Normal     Neuro/Psych  Headaches PSYCHIATRIC DISORDERS  Depression       GI/Hepatic negative GI ROS, Neg liver ROS,,,  Endo/Other  negative endocrine ROS    Renal/GU negative Renal ROS  negative genitourinary   Musculoskeletal negative musculoskeletal ROS (+)    Abdominal   Peds  Hematology negative hematology ROS (+)   Anesthesia Other Findings PreE w/ SF for severe range BP on mag  Reproductive/Obstetrics (+) Pregnancy                              Anesthesia Physical Anesthesia Plan  ASA: 3  Anesthesia Plan: Epidural   Post-op Pain Management:    Induction:   PONV Risk Score and Plan: Treatment may vary due to age or medical condition  Airway Management Planned: Natural Airway  Additional Equipment:   Intra-op Plan:   Post-operative Plan:   Informed Consent: I have reviewed the patients History and Physical, chart, labs and discussed the procedure including the risks, benefits and alternatives for the proposed anesthesia with the patient or authorized representative who has indicated his/her understanding and acceptance.       Plan Discussed with: Anesthesiologist  Anesthesia Plan Comments: (Patient identified. Risks, benefits, options discussed with patient including but not limited to bleeding, infection, nerve damage, paralysis, failed block, incomplete pain control, headache, blood pressure changes, nausea, vomiting,  reactions to medication, itching, and post partum back pain. Confirmed with bedside nurse the patient's most recent platelet count. Confirmed with the patient that they are not taking any anticoagulation, have any bleeding history or any family history of bleeding disorders. Patient expressed understanding and wishes to proceed. All questions were answered. )        Anesthesia Quick Evaluation

## 2023-10-27 LAB — SURGICAL PATHOLOGY

## 2023-10-27 LAB — COMPREHENSIVE METABOLIC PANEL WITH GFR
ALT: 14 U/L (ref 0–44)
AST: 28 U/L (ref 15–41)
Albumin: 2.5 g/dL — ABNORMAL LOW (ref 3.5–5.0)
Alkaline Phosphatase: 103 U/L (ref 38–126)
Anion gap: 11 (ref 5–15)
BUN: 5 mg/dL — ABNORMAL LOW (ref 6–20)
CO2: 24 mmol/L (ref 22–32)
Calcium: 7.3 mg/dL — ABNORMAL LOW (ref 8.9–10.3)
Chloride: 103 mmol/L (ref 98–111)
Creatinine, Ser: 0.73 mg/dL (ref 0.44–1.00)
GFR, Estimated: >60 mL/min (ref >60)
Glucose, Bld: 80 mg/dL (ref 70–99)
Potassium: 2.6 mmol/L — CL (ref 3.5–5.1)
Sodium: 138 mmol/L (ref 135–145)
Total Bilirubin: 0.4 mg/dL (ref 0.0–1.2)
Total Protein: 5.6 g/dL — ABNORMAL LOW (ref 6.5–8.1)

## 2023-10-27 LAB — BASIC METABOLIC PANEL WITH GFR
Anion gap: 10 (ref 5–15)
BUN: 7 mg/dL (ref 6–20)
CO2: 23 mmol/L (ref 22–32)
Calcium: 8.3 mg/dL — ABNORMAL LOW (ref 8.9–10.3)
Chloride: 105 mmol/L (ref 98–111)
Creatinine, Ser: 0.68 mg/dL (ref 0.44–1.00)
GFR, Estimated: >60 mL/min (ref >60)
Glucose, Bld: 84 mg/dL (ref 70–99)
Potassium: 3 mmol/L — ABNORMAL LOW (ref 3.5–5.1)
Sodium: 138 mmol/L (ref 135–145)

## 2023-10-27 MED ORDER — POTASSIUM CHLORIDE 10 MEQ/100ML IV SOLN
INTRAVENOUS | Status: AC
  Filled 2023-10-27: qty 100

## 2023-10-27 MED ORDER — POTASSIUM CHLORIDE 10 MEQ/100ML IV SOLN
10.0000 meq | INTRAVENOUS | Status: AC
  Administered 2023-10-27 (×4): 10 meq via INTRAVENOUS
  Filled 2023-10-27 (×3): qty 100

## 2023-10-27 NOTE — Lactation Note (Signed)
 This note was copied from a baby's chart. Lactation Consultation Note  Patient Name: Brenda Walter Unijb'd Date: 10/27/2023 Age:27 hours Reason for consult: Mother's request;1st time breastfeeding;Follow-up assessment MOB requested latch assistance, MOB latched infant on her left breast using the cross cradle hold, initially infant was on and off the breast, but started sustaining his latch towards the end of the feeding. Afterwards infant was spoon fed 4 mls of colostrum MOB had pumped earlier. MOB plans to continue to breastfeed infant by cues, on demand, 8+ times within 24 hours. MOB knows to call for further latch assistance if needed. MOB plans to continue to use the DEBP, every 3 hours and give infant back her EBM by spoon on Day  1 that is expressed, after latching infant at the breast.   Maternal Data    Feeding Mother's Current Feeding Choice: Breast Milk  LATCH Score Latch: Repeated attempts needed to sustain latch, nipple held in mouth throughout feeding, stimulation needed to elicit sucking reflex.  Audible Swallowing: A few with stimulation  Type of Nipple: Everted at rest and after stimulation  Comfort (Breast/Nipple): Soft / non-tender  Hold (Positioning): Assistance needed to correctly position infant at breast and maintain latch.  LATCH Score: 7   Lactation Tools Discussed/Used    Interventions Interventions: Assisted with latch;Skin to skin;Breast compression;Adjust position;Support pillows;Position options;Expressed milk;Education  Discharge    Consult Status Consult Status: Follow-up Date: 10/27/23 Follow-up type: In-patient    Brenda Walter 10/27/2023, 1:33 AM

## 2023-10-28 LAB — COMPREHENSIVE METABOLIC PANEL WITH GFR
ALT: 18 U/L (ref 0–44)
AST: 29 U/L (ref 15–41)
Albumin: 2.8 g/dL — ABNORMAL LOW (ref 3.5–5.0)
Alkaline Phosphatase: 117 U/L (ref 38–126)
Anion gap: 11 (ref 5–15)
BUN: 8 mg/dL (ref 6–20)
CO2: 22 mmol/L (ref 22–32)
Calcium: 8.9 mg/dL (ref 8.9–10.3)
Chloride: 107 mmol/L (ref 98–111)
Creatinine, Ser: 0.7 mg/dL (ref 0.44–1.00)
GFR, Estimated: >60 mL/min (ref >60)
Glucose, Bld: 100 mg/dL — ABNORMAL HIGH (ref 70–99)
Potassium: 3.4 mmol/L — ABNORMAL LOW (ref 3.5–5.1)
Sodium: 140 mmol/L (ref 135–145)
Total Bilirubin: 0.4 mg/dL (ref 0.0–1.2)
Total Protein: 6.5 g/dL (ref 6.5–8.1)

## 2023-10-28 MED ORDER — SODIUM CHLORIDE 0.9% FLUSH
3.0000 mL | Freq: Two times a day (BID) | INTRAVENOUS | Status: DC
  Administered 2023-10-28: 3 mL via INTRAVENOUS

## 2023-10-28 MED ORDER — NIFEDIPINE ER OSMOTIC RELEASE 30 MG PO TB24: 30.0000 mg | ORAL_TABLET | Freq: Every day | ORAL | Status: DC

## 2023-10-28 MED ORDER — SODIUM CHLORIDE 0.9% FLUSH: 3.0000 mL | INTRAVENOUS | Status: DC | PRN

## 2023-10-28 MED ORDER — POTASSIUM CHLORIDE CRYS ER 20 MEQ PO TBCR
20.0000 meq | EXTENDED_RELEASE_TABLET | Freq: Once | ORAL | Status: AC
Start: 2023-10-28 — End: 2023-10-28
  Administered 2023-10-28: 20 meq via ORAL
  Filled 2023-10-28: qty 1

## 2023-10-28 MED ORDER — NIFEDIPINE ER OSMOTIC RELEASE 30 MG PO TB24
30.0000 mg | ORAL_TABLET | Freq: Every day | ORAL | Status: DC
  Administered 2023-10-28: 30 mg via ORAL
  Filled 2023-10-28: qty 1

## 2023-10-28 MED ORDER — NIFEDIPINE ER OSMOTIC RELEASE 30 MG PO TB24
30.0000 mg | ORAL_TABLET | Freq: Once | ORAL | Status: AC
  Administered 2023-10-28: 30 mg via ORAL
  Filled 2023-10-28: qty 1

## 2023-10-28 MED ORDER — NIFEDIPINE ER OSMOTIC RELEASE 30 MG PO TB24
30.0000 mg | ORAL_TABLET | Freq: Two times a day (BID) | ORAL | Status: DC
  Administered 2023-10-29: 30 mg via ORAL
  Filled 2023-10-28: qty 1

## 2023-10-28 NOTE — Progress Notes (Signed)
 Patient ID: Brenda Walter, female   DOB: May 29, 1995, 28 y.o.   MRN: [id number] BP still elevated after labetalol  thus procardia  30XL given at 1100.  Repeat BP two hrs later noted BP still elevated thus recommend giving labetalol  200mg  as planned.  Mild tachycardia still noted   K however improved to 3.4 Plan oral replacement only at this time.   Continue to monitor for pp care

## 2023-10-28 NOTE — Progress Notes (Signed)
 Post Partum Day 2 Subjective: up ad lib, voiding, tolerating PO, + flatus, and lochia mild. She denies HA, CP, SOB, and visual changes. She admits to fatigue and nausea this am but improved with zofran . +appetite. She is bonding well with baby. Breastfeeding   Objective: Blood pressure (!) 144/95, pulse (!) 107, temperature 98.3 F (36.8 C), temperature source Oral, resp. rate 17, height 5' 5 (1.651 m), weight 95.9 kg, last menstrual period 02/09/2023, SpO2 95%, unknown if currently breastfeeding.  Physical Exam:  General: alert, cooperative, fatigued, and no distress Lochia: appropriate Uterine Fundus: firm Incision: n/a DVT Evaluation: No evidence of DVT seen on physical exam.  Recent Labs    10/25/23 2350 10/26/23 1009  HGB 11.2* 10.4*  HCT 33.9* 31.5*    Assessment/Plan: Breastfeeding and Circumcision prior to discharge- discussed  Recheck cmp for K+ level this am and treat accordingly BP still not adequately controlled - repeat now. If still elevated consider increased dose vs change medication Routine pp vare    LOS: 3 days   Brenda Walter Brenda Yvonne Stopher, DO 10/28/2023, 10:03 AM

## 2023-10-28 NOTE — Lactation Note (Signed)
 This note was copied from a baby's chart. Lactation Consultation Note  Patient Name: Brenda Walter Date: 10/28/2023 Age:27 hours Reason for consult: Follow-up assessment;Primapara;Early term 14-38.6wks Mom had just finished using DEBP when LC came into rm. Mom excited to show LC her milk she pumped. Praised mom. After mom changed diaper, she put baby on the breast. Baby wasn't that interested in latching. LC assisted in trying to get baby latched. Gave mom shells to assist in everting nipples more. Baby acted as if he couldn't feel them at intervals. Mom will wear them in am. After mom attempted to BF, mom gave baby her EBM. He was taking that well. Answered mom's questions. Encouraged to call for assistance as needed.  Maternal Data Has patient been taught Hand Expression?: Yes Does the patient have breastfeeding experience prior to this delivery?: No  Feeding Mother's Current Feeding Choice: Breast Milk and Formula  LATCH Score Latch: Too sleepy or reluctant, no latch achieved, no sucking elicited.  Audible Swallowing: None  Type of Nipple: Everted at rest and after stimulation  Comfort (Breast/Nipple): Filling, red/small blisters or bruises, mild/mod discomfort (filling/nipple sensitive. mom staes has exema on them)  Hold (Positioning): Assistance needed to correctly position infant at breast and maintain latch.  LATCH Score: 4   Lactation Tools Discussed/Used Tools: Pump Breast pump type: Double-Electric Breast Pump Pumping frequency: q 3 hrs Pumped volume: 23 mL  Interventions Interventions: Breast feeding basics reviewed;Assisted with latch;Hand express;Breast massage;Breast compression;Adjust position;Support pillows;Position options;Expressed milk;DEBP;Pace feeding;Education  Discharge    Consult Status Consult Status: Follow-up Date: 10/28/23 Follow-up type: In-patient    Brenda Walter 10/28/2023, 12:08 AM

## 2023-10-28 NOTE — Progress Notes (Signed)
 Patient ID: Brenda Walter, female   DOB: April 13, 1996, 28 y.o.   MRN: [id number] BP improved at last check will continue with labetalol  200mg  q 8 hrs and procardia  30xl q am at this time.   Please continue to monitor BP q 4hrs

## 2023-10-28 NOTE — Plan of Care (Signed)
  Problem: Education: Goal: Knowledge of General Education information will improve Description: Including pain rating scale, medication(s)/side effects and non-pharmacologic comfort measures Outcome: Progressing   Problem: Health Behavior/Discharge Planning: Goal: Ability to manage health-related needs will improve Outcome: Progressing   Problem: Clinical Measurements: Goal: Ability to maintain clinical measurements within normal limits will improve Outcome: Progressing Goal: Will remain free from infection Outcome: Progressing Goal: Diagnostic test results will improve Outcome: Progressing Goal: Respiratory complications will improve Outcome: Progressing Goal: Cardiovascular complication will be avoided Outcome: Progressing   Problem: Activity: Goal: Risk for activity intolerance will decrease Outcome: Progressing   Problem: Nutrition: Goal: Adequate nutrition will be maintained Outcome: Progressing   Problem: Coping: Goal: Level of anxiety will decrease Outcome: Progressing   Problem: Elimination: Goal: Will not experience complications related to bowel motility Outcome: Progressing Goal: Will not experience complications related to urinary retention Outcome: Progressing   Problem: Pain Managment: Goal: General experience of comfort will improve and/or be controlled Outcome: Progressing   Problem: Safety: Goal: Ability to remain free from injury will improve Outcome: Progressing   Problem: Skin Integrity: Goal: Risk for impaired skin integrity will decrease Outcome: Progressing   Problem: Education: Goal: Knowledge of Childbirth will improve Outcome: Progressing Goal: Ability to make informed decisions regarding treatment and plan of care will improve Outcome: Progressing Goal: Ability to state and carry out methods to decrease the pain will improve Outcome: Progressing Goal: Individualized Educational Video(s) Outcome: Progressing   Problem:  Coping: Goal: Ability to verbalize concerns and feelings about labor and delivery will improve Outcome: Progressing   Problem: Life Cycle: Goal: Ability to make normal progression through stages of labor will improve Outcome: Progressing Goal: Ability to effectively push during vaginal delivery will improve Outcome: Progressing   Problem: Role Relationship: Goal: Will demonstrate positive interactions with the child Outcome: Progressing   Problem: Safety: Goal: Risk of complications during labor and delivery will decrease Outcome: Progressing   Problem: Pain Management: Goal: Relief or control of pain from uterine contractions will improve Outcome: Progressing   Problem: Education: Goal: Knowledge of disease or condition will improve Outcome: Progressing Goal: Knowledge of the prescribed therapeutic regimen will improve Outcome: Progressing   Problem: Fluid Volume: Goal: Peripheral tissue perfusion will improve Outcome: Progressing   Problem: Clinical Measurements: Goal: Complications related to disease process, condition or treatment will be avoided or minimized Outcome: Progressing   Problem: Education: Goal: Knowledge of condition will improve Outcome: Progressing Goal: Individualized Educational Video(s) Outcome: Progressing Goal: Individualized Newborn Educational Video(s) Outcome: Progressing   Problem: Activity: Goal: Will verbalize the importance of balancing activity with adequate rest periods Outcome: Progressing Goal: Ability to tolerate increased activity will improve Outcome: Progressing   Problem: Coping: Goal: Ability to identify and utilize available resources and services will improve Outcome: Progressing   Problem: Life Cycle: Goal: Chance of risk for complications during the postpartum period will decrease Outcome: Progressing   Problem: Role Relationship: Goal: Ability to demonstrate positive interaction with newborn will  improve Outcome: Progressing   Problem: Skin Integrity: Goal: Demonstration of wound healing without infection will improve Outcome: Progressing

## 2023-10-28 NOTE — Lactation Note (Signed)
 This note was copied from a baby's chart. Lactation Consultation Note  Patient Name: Brenda Walter Date: 10/28/2023 Age:27 hours Reason for consult: Follow-up assessment;Primapara;1st time breastfeeding;Exclusive pumping and bottle feeding;Early term 37-38.6wks  LC in to visit with P1 Mom of ET infant Brenda Walter born vaginally. Baby has a 0% weight loss. Mom with post partum pre-eclampsia and had to be put on magnesium  sulfate, low potassium and still elevated BP.   Mom has been not feeling well today, GI upset.  Baby has been bottle feeding primarily EBM+/formula.  LC in to consult and baby had just left for circumcision.  Mom had pump parts on the bed and in a basin on soaked paper towel.  As LC was teaching and questioning Mom, she washed the pump parts and provided a clean dry basin for drying.    Last pumping Mom expressed 40 ml, but that was early this am.  LC talked about the importance of consistent pumping if baby is being supplemented and not latching on.  Mom states she doesn't care for latching as it was uncomfortable and she would like to primarily pump.    LC provided Mom with a pumping band and assisted with pumping.  Encouraged hands on pumping to stimulate and support a full milk supply.  Mom to use maintain mode on pump now.  Plan- 1- STS with Brenda Walter as much as possible 2- If baby is not breastfeeding, supplement per volume guidelines using EBM first 3- Pump both breasts 20-30 mins on maintain mode. 4- ask for assistance prn 5- OP lactation f/u after discharge.  Lactation Tools Discussed/Used Tools: Pump;Flanges;Bottle;Coconut oil Flange Size:  (17 flange inserts) Breast pump type: Double-Electric Breast Pump Pump Education: Setup, frequency, and cleaning;Milk Storage Reason for Pumping: support milk supply Pumping frequency: Encouraged to pump both breasts on maintain mode (20-30 mins) at each feeding if baby is supplemented Pumped volume: 40  mL  Interventions Interventions: Breast feeding basics reviewed;Skin to skin;Breast massage;Hand express;Coconut oil;DEBP;Education  Discharge Discharge Education: Engorgement and breast care;Outpatient recommendation Pump: Personal;DEBP (Spectra  2) WIC Program: No  Consult Status Consult Status: Follow-up Date: 10/29/23 Follow-up type: In-patient    Brenda Walter 10/28/2023, 1:55 PM

## 2023-10-28 NOTE — Progress Notes (Signed)
 Patient ID: Brenda Walter, female   DOB: [birth year], 28 y.o.   MRN: [id number] Repeat BP noted to be elevated again 159/83 ( repeated and confirmed x 2)   Pt denies HA or blurry vision   Pt due for labetalol  at 10pm but recommend taking now.  If BP still elevated will add a pm dose of procardia  as well  Spent time explaining mechanism of action of anticholinergic and beta blocker on cardiac function and subsequent improvement expected with BP

## 2023-10-28 NOTE — Progress Notes (Signed)
 Patient ID: Brenda Walter, female   DOB: [birth year], 28 y.o.   MRN: [id number] Repeat BP an hour after 200mg  labetalol  done  Improved but still high at 144/83  Will add a pm dose of 30mg  procardia  now   Pt now to be on 30mg  procardia  bid and 200mg  labetalol  tid

## 2023-10-29 LAB — COMPREHENSIVE METABOLIC PANEL WITH GFR
ALT: 20 U/L (ref 0–44)
AST: 32 U/L (ref 15–41)
Albumin: 3.1 g/dL — ABNORMAL LOW (ref 3.5–5.0)
Alkaline Phosphatase: 106 U/L (ref 38–126)
Anion gap: 11 (ref 5–15)
BUN: 8 mg/dL (ref 6–20)
CO2: 24 mmol/L (ref 22–32)
Calcium: 9.3 mg/dL (ref 8.9–10.3)
Chloride: 102 mmol/L (ref 98–111)
Creatinine, Ser: 0.72 mg/dL (ref 0.44–1.00)
GFR, Estimated: >60 mL/min (ref >60)
Glucose, Bld: 93 mg/dL (ref 70–99)
Potassium: 3.4 mmol/L — ABNORMAL LOW (ref 3.5–5.1)
Sodium: 137 mmol/L (ref 135–145)
Total Bilirubin: 0.5 mg/dL (ref 0.0–1.2)
Total Protein: 6.9 g/dL (ref 6.5–8.1)

## 2023-10-29 MED ORDER — LABETALOL HCL 200 MG PO TABS: 200.0000 mg | ORAL_TABLET | Freq: Three times a day (TID) | ORAL | 1 refills | Status: AC

## 2023-10-29 MED ORDER — POTASSIUM CHLORIDE CRYS ER 20 MEQ PO TBCR
20.0000 meq | EXTENDED_RELEASE_TABLET | Freq: Every day | ORAL | Status: DC
  Administered 2023-10-29: 20 meq via ORAL
  Filled 2023-10-29: qty 1

## 2023-10-29 MED ORDER — POTASSIUM CHLORIDE CRYS ER 20 MEQ PO TBCR: 20.0000 meq | EXTENDED_RELEASE_TABLET | Freq: Every day | ORAL | 0 refills | Status: AC

## 2023-10-29 MED ORDER — ONDANSETRON HCL 4 MG PO TABS: 4.0000 mg | ORAL_TABLET | Freq: Four times a day (QID) | ORAL | 0 refills | Status: AC | PRN

## 2023-10-29 MED ORDER — NIFEDIPINE ER 30 MG PO TB24
30.0000 mg | ORAL_TABLET | Freq: Two times a day (BID) | ORAL | 1 refills | Status: AC
Start: 2023-10-29 — End: ?

## 2023-10-29 NOTE — Plan of Care (Signed)
  Problem: Education: Goal: Knowledge of General Education information will improve Description: Including pain rating scale, medication(s)/side effects and non-pharmacologic comfort measures Outcome: Adequate for Discharge   Problem: Health Behavior/Discharge Planning: Goal: Ability to manage health-related needs will improve Outcome: Adequate for Discharge   Problem: Clinical Measurements: Goal: Ability to maintain clinical measurements within normal limits will improve Outcome: Adequate for Discharge Goal: Will remain free from infection Outcome: Adequate for Discharge Goal: Diagnostic test results will improve Outcome: Adequate for Discharge Goal: Respiratory complications will improve Outcome: Adequate for Discharge Goal: Cardiovascular complication will be avoided Outcome: Adequate for Discharge   Problem: Coping: Goal: Level of anxiety will decrease Outcome: Adequate for Discharge   Problem: Elimination: Goal: Will not experience complications related to bowel motility Outcome: Adequate for Discharge   Problem: Pain Managment: Goal: General experience of comfort will improve and/or be controlled Outcome: Adequate for Discharge   Problem: Safety: Goal: Ability to remain free from injury will improve Outcome: Adequate for Discharge   Problem: Skin Integrity: Goal: Risk for impaired skin integrity will decrease Outcome: Adequate for Discharge   Problem: Education: Goal: Knowledge of Childbirth will improve Outcome: Adequate for Discharge Goal: Ability to make informed decisions regarding treatment and plan of care will improve Outcome: Adequate for Discharge Goal: Ability to state and carry out methods to decrease the pain will improve Outcome: Adequate for Discharge Goal: Individualized Educational Video(s) Outcome: Adequate for Discharge   Problem: Coping: Goal: Ability to verbalize concerns and feelings about labor and delivery will improve Outcome:  Adequate for Discharge   Problem: Life Cycle: Goal: Ability to make normal progression through stages of labor will improve Outcome: Adequate for Discharge Goal: Ability to effectively push during vaginal delivery will improve Outcome: Adequate for Discharge   Problem: Role Relationship: Goal: Will demonstrate positive interactions with the child Outcome: Adequate for Discharge   Problem: Safety: Goal: Risk of complications during labor and delivery will decrease Outcome: Adequate for Discharge   Problem: Pain Management: Goal: Relief or control of pain from uterine contractions will improve Outcome: Adequate for Discharge   Problem: Education: Goal: Knowledge of disease or condition will improve Outcome: Adequate for Discharge Goal: Knowledge of the prescribed therapeutic regimen will improve Outcome: Adequate for Discharge   Problem: Fluid Volume: Goal: Peripheral tissue perfusion will improve Outcome: Adequate for Discharge   Problem: Clinical Measurements: Goal: Complications related to disease process, condition or treatment will be avoided or minimized Outcome: Adequate for Discharge   Problem: Education: Goal: Knowledge of condition will improve Outcome: Adequate for Discharge Goal: Individualized Educational Video(s) Outcome: Adequate for Discharge Goal: Individualized Newborn Educational Video(s) Outcome: Adequate for Discharge   Problem: Activity: Goal: Will verbalize the importance of balancing activity with adequate rest periods Outcome: Adequate for Discharge Goal: Ability to tolerate increased activity will improve Outcome: Adequate for Discharge   Problem: Coping: Goal: Ability to identify and utilize available resources and services will improve Outcome: Adequate for Discharge   Problem: Life Cycle: Goal: Chance of risk for complications during the postpartum period will decrease Outcome: Adequate for Discharge   Problem: Role  Relationship: Goal: Ability to demonstrate positive interaction with newborn will improve Outcome: Adequate for Discharge   Problem: Skin Integrity: Goal: Demonstration of wound healing without infection will improve Outcome: Adequate for Discharge

## 2023-10-29 NOTE — Progress Notes (Signed)
 Patient is doing well.  She is ambulating, voiding, tolerating PO.  Pain control is good.  Lochia is appropriate.  BP is much improved overnight.  She strongly desires discharge to home today  Vitals:   10/28/23 2146 10/29/23 0030 10/29/23 0630 10/29/23 0633  BP: (!) 159/83 136/85 137/79 137/79  Pulse: (!) 109 96 (!) 107 (!) 107  Resp:  18 18   Temp:  98.2 F (36.8 C) 98 F (36.7 C)   TempSrc:  Oral Oral   SpO2:  100%    Weight:      Height:        NAD Fundus firm Ext: trace LE edema  Lab Results  Component Value Date   WBC 18.3 (H) 10/26/2023   HGB 10.4 (L) 10/26/2023   HCT 31.5 (L) 10/26/2023   MCV 84.7 10/26/2023   PLT 247 10/26/2023    --/--/AB POS (07/14 1746)  A/P 28 y.o. G1P1001 PPD#3 s/p SVD at 37 weeks complicated by severe preeclampsia. Routine postpartum care.   Severe preeclampsia: s/p 24 hrs of PP magnesium  sulfate.  Now on nifedipine  XL 30mg  BID and labetalol  200mg  TID.  Controlled in the 130s/70-80s.  Asymptomatic.   Mild intermittent tachycardia--present since admission (was in 110s, now 90s-100s).  Asymptomatic and no s/sx of infection Hypokalemia: K improved to 3.4 and stable.  Discharge home with potassium replacement and repeat in office next week   [facility] GEFFEL [facility]

## 2023-10-29 NOTE — Discharge Summary (Signed)
 Postpartum Discharge Summary  Date of Service updated 10/29/23     Patient Name: Brenda Walter DOB: [birth year] MRN: [id number]  Date of admission: 10/25/2023 Delivery date:10/26/2023 Delivering provider: SUDIE LAVONIA HERO Date of discharge: 10/29/2023  Admitting diagnosis: Pregnancy [Z34.90] Intrauterine pregnancy: [redacted]w[redacted]d     Secondary diagnosis:  Principal Problem:   Pregnancy  Additional problems: PROM, severe preeclampsia    Discharge diagnosis: Term Pregnancy Delivered and Preeclampsia (severe)                                              Post partum procedures:n/a Augmentation: AROM and Pitocin  Complications: None  Hospital course: Induction of Labor With Vaginal Delivery   28 y.o. yo G1P1001 at [redacted]w[redacted]d was admitted to the hospital 10/25/2023 for induction of labor.  Indication for induction: PROM.  Patient had an labor course complicated by development of severe preeclampsia for which she was started on magnesium  sulfate Membrane Rupture Time/Date: 12:00 PM,10/23/2023  Delivery Method:Vaginal, Spontaneous Operative Delivery:N/A Episiotomy: None Lacerations:  None Details of delivery can be found in separate delivery note.  Patient had a postpartum course complicated by severe preeclampsia.  She received 24 hours of magnesium  sulfate postpartum.  BP was ultimately controlled by PPD#3 on labetalol  200 mg TID and nifedipine  Xl 30 mg BID.  She also had hypokalemia during her admission that required potassium replacement.  On day of discharge it was stable at 3.4 and she was discharged on potassium replacement. Patient is discharged home 10/29/23.  Newborn Data: Birth date:10/26/2023 Birth time:3:46 AM Gender:Female Living status:Living Apgars:7 ,9  Weight:2870 g  Magnesium  Sulfate received: Yes: Seizure prophylaxis BMZ received: No Rhophylac:N/A MMR:N/A T-DaP:Given prenatally Flu: No RSV Vaccine received: No Transfusion:No Immunizations administered: Immunization  History  Administered Date(s) Administered   DTaP 06/30/1996, 08/24/1996, 10/23/1996, 09/28/1997, 07/25/2001   HIB (PRP-OMP) 06/30/1996, 08/24/1996, 10/23/1996, 09/28/1997   Hepatitis A 04/10/2005, 11/02/2007   Hepatitis B 09-16-96, 06/30/1996, 10/23/1996   Influenza-Unspecified 02/11/2001, 03/21/2001, 02/06/2002, 02/06/2003, 04/10/2005, 03/21/2009   MMR 04/26/1997, 07/25/2001   Meningococcal Conjugate 11/02/2007, 07/25/2014   Tdap 11/02/2007   Varicella 04/26/1997, 11/02/2007    Physical exam  Vitals:   10/29/23 0030 10/29/23 0630 10/29/23 0633 10/29/23 0828  BP: 136/85 137/79 137/79 120/68  Pulse: 96 (!) 107 (!) 107 99  Resp: 18 18  17   Temp: 98.2 F (36.8 C) 98 F (36.7 C)  98 F (36.7 C)  TempSrc: Oral Oral  Oral  SpO2: 100%   96%  Weight:      Height:       General: alert, cooperative, and no distress Lochia: appropriate Uterine Fundus: firm Incision: N/A DVT Evaluation: No evidence of DVT seen on physical exam. Labs: Lab Results  Component Value Date   WBC 18.3 (H) 10/26/2023   HGB 10.4 (L) 10/26/2023   HCT 31.5 (L) 10/26/2023   MCV 84.7 10/26/2023   PLT 247 10/26/2023      Latest Ref Rng & Units 10/29/2023    4:45 AM  CMP  Glucose 70 - 99 mg/dL 93   BUN 6 - 20 mg/dL 8   Creatinine 9.55 - 8.99 mg/dL 9.27   Sodium 864 - 854 mmol/L 137   Potassium 3.5 - 5.1 mmol/L 3.4   Chloride 98 - 111 mmol/L 102   CO2 22 - 32 mmol/L 24   Calcium 8.9 - 10.3  mg/dL 9.3   Total Protein 6.5 - 8.1 g/dL 6.9   Total Bilirubin 0.0 - 1.2 mg/dL 0.5   Alkaline Phos 38 - 126 U/L 106   AST 15 - 41 U/L 32   ALT 0 - 44 U/L 20    Edinburgh Score:    10/28/2023    6:43 AM  Van Postnatal Depression Scale Screening Tool  I have been able to laugh and see the funny side of things. 0  I have looked forward with enjoyment to things. 0  I have blamed myself unnecessarily when things went wrong. 0  I have been anxious or worried for no good reason. 0  I have felt scared or  panicky for no good reason. 0  Things have been getting on top of me. 0  I have been so unhappy that I have had difficulty sleeping. 0  I have felt sad or miserable. 0  I have been so unhappy that I have been crying. 0  The thought of harming myself has occurred to me. 0  Edinburgh Postnatal Depression Scale Total 0      After visit meds:  Allergies as of 10/29/2023   No Known Allergies      Medication List     TAKE these medications    labetalol  200 MG tablet Commonly known as: NORMODYNE  Take 1 tablet (200 mg total) by mouth 3 (three) times daily.   NIFEdipine  30 MG 24 hr tablet Commonly known as: ADALAT  CC Take 1 tablet (30 mg total) by mouth 2 (two) times daily.   ondansetron  4 MG tablet Commonly known as: ZOFRAN  Take 1 tablet (4 mg total) by mouth every 6 (six) hours as needed for nausea.   potassium chloride  SA 20 MEQ tablet Commonly known as: KLOR-CON  M Take 1 tablet (20 mEq total) by mouth daily.         Discharge home in stable condition Infant Feeding: Breast Infant Disposition:home with mother Discharge instruction: per After Visit Summary and Postpartum booklet. Activity: Advance as tolerated. Pelvic rest for 6 weeks.  Diet: routine diet Anticipated Birth Control: Unsure Postpartum Appointment:6 weeks Additional Postpartum F/U: BP check 5 days Future Appointments:No future appointments. Follow up Visit:  Follow-up Information     [facility] Follow up in 5 day(s).   Why: 5-7 days BP check and potassium check Contact information: [address]  [address] [place] [address] [phone]                     10/29/2023 [facility] GEFFEL GRETTA, MD

## 2023-10-29 NOTE — Progress Notes (Signed)
   10/29/23 1808  Departure Condition  Departure Condition Good  Mobility at [facility]  Patient/Caregiver Teaching Teach Back Method Used;Discharge instructions reviewed;Prescriptions reviewed;Follow-up care reviewed;Admission discussed;Pain management discussed;Medications discussed;Patient/caregiver verbalized understanding  Departure Mode With significant other  Was procedural sedation performed on this patient during this visit? No   Pt alert and oriented x4, vital signs and pain stable at discharge

## 2023-10-29 NOTE — Progress Notes (Addendum)
 Patient ID: Brenda Walter, female   DOB: [birth year], 28 y.o.   MRN: [id number] BP 136-137/79-85 since midnight  Mild tachycardia still present   K+ still at 3.4  Pt with no complaints  Plan to maintain on procardia  30xl bid and labetalol  200mg  po tid  AM dose of both given at 630am  Kdur due this am   Possible discharge to home today

## 2023-10-29 NOTE — Lactation Note (Signed)
 This note was copied from a baby's chart. Lactation Consultation Note  Patient Name: Brenda Walter Date: 10/29/2023 Age:27 days Reason for consult: Follow-up assessment;Maternal discharge  P1, 37 wks, @ 3 days of life. Discharge anticipated today. With LC arrival- mom feeding ounces of pumped breast milk and per mom has another bottle in the fridge as well. Praised mom, amazing results.  Encouraged mom to keep working on big mouth latch with baby and use EBM or coconut oil after each feed- Coconut oil provided. Discussed cluster feeding overnight/ early morning brings in our milk supply, shared expectations of milk coming in. Highlighted risk of engorgement. Discussed hand pump/express to soften breasts, motrin  as anti-inflammatory, and ice packs for 10-20 minutes post feed/pumping if still over-full is the best treatments for inflamed/engorged breasts.  Maternal Data Has patient been taught Hand Expression?: Yes Does the patient have breastfeeding experience prior to this delivery?: No  Feeding Mother's Current Feeding Choice: Breast Milk and Formula  Interventions Interventions: Expressed milk;Coconut oil;Hand pump;DEBP;Education;LC Services brochure;CDC milk storage guidelines  Discharge Discharge Education: Engorgement and breast care Pump: DEBP;Manual;Personal (Spectra , and mom aware hand pump make from breast kit parts)  Consult Status Consult Status: Complete Date: 10/29/23 Follow-up type: In-patient    [facility] 10/29/2023, 5:39 PM

## 2023-11-09 ENCOUNTER — Telehealth (HOSPITAL_COMMUNITY): Payer: Self-pay

## 2023-11-09 NOTE — Telephone Encounter (Signed)
 11/09/2023 1859  Name: Brenda Walter MRN: [id number] DOB: [birth year]  Reason for Call:  Transition of Care Hospital Discharge Call  Contact Status: Patient Contact Status: Message  Language assistant needed:          Follow-Up Questions:    Van Postnatal Depression Scale:  In the Past 7 Days:    PHQ2-9 Depression Scale:     Discharge Follow-up:    Post-discharge interventions: NA  Signature  Rosaline Deretha PEAK

## 2023-11-23 ENCOUNTER — Encounter (HOSPITAL_COMMUNITY)

## 2024-04-27 ENCOUNTER — Encounter: Payer: Self-pay | Admitting: Nurse Practitioner

## 2024-04-27 ENCOUNTER — Ambulatory Visit: Admitting: Nurse Practitioner

## 2024-04-27 VITALS — BP 110/61 | HR 94 | Temp 97.7°F | Wt 224.0 lb

## 2024-04-27 DIAGNOSIS — R1011 Right upper quadrant pain: Secondary | ICD-10-CM | POA: Diagnosis not present

## 2024-04-27 DIAGNOSIS — Z1329 Encounter for screening for other suspected endocrine disorder: Secondary | ICD-10-CM

## 2024-04-27 DIAGNOSIS — Z13220 Encounter for screening for lipoid disorders: Secondary | ICD-10-CM | POA: Diagnosis not present

## 2024-04-27 NOTE — Progress Notes (Signed)
 "  Subjective   Patient ID: Brenda Walter, female    DOB: 07-22-1995, 29 y.o.   MRN: [id number]  Chief Complaint  Patient presents with   New Patient (Initial Visit)    Est. Care. Discuss gallbladder and eczema.     Referring provider: Gretta Comer POUR, NP  Brenda Walter is a 29 y.o. female with Past Medical History: No date: Allergy No date: Asthma No date: Chicken pox No date: Headache   HPI  Patient presents today to establish care.  She is concerned for possible gallstones.  She did recently have a baby 6 months ago.  Recently she has been having clay colored stools and right upper quadrant abdominal pain.  She notices this after eating.  Especially fried foods.  She states that her sister is an ultrasound tech and is going to do an ultrasound of her gallbladder this weekend.  We will place a referral to GI today as well. Denies f/c/s, n/v/d, hemoptysis, PND, leg swelling Denies chest pain or edema   Intestinal       Allergies[1]  Immunization History  Administered Date(s) Administered   DTaP 06/30/1996, 08/24/1996, 10/23/1996, 09/28/1997, 07/25/2001   HIB (PRP-OMP) 06/30/1996, 08/24/1996, 10/23/1996, 09/28/1997   Hepatitis A 04/10/2005, 11/02/2007   Hepatitis B March 23, 1997, 06/30/1996, 10/23/1996   Influenza-Unspecified 02/11/2001, 03/21/2001, 02/06/2002, 02/06/2003, 04/10/2005, 03/21/2009   MMR 04/26/1997, 07/25/2001   Meningococcal Conjugate 11/02/2007, 07/25/2014   Moderna Sars-Covid-2 Vaccination 06/18/2020, 07/21/2020   Tdap 11/02/2007   Varicella 04/26/1997, 11/02/2007    Tobacco History: Tobacco Use History[2] Counseling given: Not Answered   Outpatient Encounter Medications as of 04/27/2024  Medication Sig   labetalol  (NORMODYNE ) 200 MG tablet Take 1 tablet (200 mg total) by mouth 3 (three) times daily. (Patient not taking: Reported on 04/27/2024)   NIFEdipine  (ADALAT  CC) 30 MG 24 hr tablet Take 1 tablet (30 mg total) by mouth 2 (two) times  daily. (Patient not taking: Reported on 04/27/2024)   ondansetron  (ZOFRAN ) 4 MG tablet Take 1 tablet (4 mg total) by mouth every 6 (six) hours as needed for nausea. (Patient not taking: Reported on 04/27/2024)   potassium chloride  SA (KLOR-CON  M) 20 MEQ tablet Take 1 tablet (20 mEq total) by mouth daily. (Patient not taking: Reported on 04/27/2024)   No facility-administered encounter medications on file as of 04/27/2024.    Review of Systems  Review of Systems  Constitutional: Negative.   HENT: Negative.    Cardiovascular: Negative.   Gastrointestinal: Negative.   Allergic/Immunologic: Negative.   Neurological: Negative.   Psychiatric/Behavioral: Negative.       Objective:   BP 110/61   Pulse 94   Temp 97.7 F (36.5 C) (Oral)   Wt 224 lb (101.6 kg)   SpO2 100%   BMI 37.28 kg/m   Wt Readings from Last 5 Encounters:  04/27/24 224 lb (101.6 kg)  10/25/23 211 lb 6.4 oz (95.9 kg)  07/23/15 130 lb (59 kg) (56%, Z= 0.14)*  11/29/14 127 lb 1.9 oz (57.7 kg) (54%, Z= 0.09)*  09/03/14 131 lb (59.4 kg) (61%, Z= 0.29)*   * Growth percentiles are based on CDC (Girls, 2-20 Years) data.     Physical Exam Vitals and nursing note reviewed.  Constitutional:      General: She is not in acute distress.    Appearance: She is well-developed.  Cardiovascular:     Rate and Rhythm: Normal rate and regular rhythm.  Pulmonary:     Effort: Pulmonary effort is normal.  Breath sounds: Normal breath sounds.  Abdominal:     General: Abdomen is flat. Bowel sounds are normal.     Tenderness: There is no abdominal tenderness.  Neurological:     Mental Status: She is alert and oriented to person, place, and time.       Assessment & Plan:   Lipid screening -     Lipid panel  Thyroid disorder screen -     TSH  Right upper quadrant pain -     CBC -     Comprehensive metabolic panel with GFR -     Lipase -     Amylase     Return in about 6 months (around 10/25/2024).   Bascom GORMAN Borer, NP 04/28/2024     [1] No Known Allergies [2]  Social History Tobacco Use  Smoking Status Never  Smokeless Tobacco Not on file   "

## 2024-04-28 ENCOUNTER — Ambulatory Visit: Payer: Self-pay | Admitting: Nurse Practitioner

## 2024-04-28 LAB — COMPREHENSIVE METABOLIC PANEL WITH GFR
ALT: 21 IU/L (ref 0–32)
AST: 24 IU/L (ref 0–40)
Albumin: 5 g/dL (ref 4.0–5.0)
Alkaline Phosphatase: 97 IU/L (ref 41–116)
BUN/Creatinine Ratio: 21 (ref 9–23)
BUN: 16 mg/dL (ref 6–20)
Bilirubin Total: 0.3 mg/dL (ref 0.0–1.2)
CO2: 22 mmol/L (ref 20–29)
Calcium: 10.1 mg/dL (ref 8.7–10.2)
Chloride: 101 mmol/L (ref 96–106)
Creatinine, Ser: 0.78 mg/dL (ref 0.57–1.00)
Globulin, Total: 3.1 g/dL (ref 1.5–4.5)
Glucose: 97 mg/dL (ref 70–99)
Potassium: 4.3 mmol/L (ref 3.5–5.2)
Sodium: 139 mmol/L (ref 134–144)
Total Protein: 8.1 g/dL (ref 6.0–8.5)
eGFR: 106 mL/min/1.73

## 2024-04-28 LAB — LIPID PANEL
Chol/HDL Ratio: 5.9 ratio — ABNORMAL HIGH (ref 0.0–4.4)
Cholesterol, Total: 234 mg/dL — ABNORMAL HIGH (ref 100–199)
HDL: 40 mg/dL
LDL Chol Calc (NIH): 167 mg/dL — ABNORMAL HIGH (ref 0–99)
Triglycerides: 149 mg/dL (ref 0–149)
VLDL Cholesterol Cal: 27 mg/dL (ref 5–40)

## 2024-04-28 LAB — CBC
Hematocrit: 40.7 % (ref 34.0–46.6)
Hemoglobin: 13.4 g/dL (ref 11.1–15.9)
MCH: 28.5 pg (ref 26.6–33.0)
MCHC: 32.9 g/dL (ref 31.5–35.7)
MCV: 87 fL (ref 79–97)
Platelets: 378 x10E3/uL (ref 150–450)
RBC: 4.7 x10E6/uL (ref 3.77–5.28)
RDW: 12.4 % (ref 11.7–15.4)
WBC: 8.2 x10E3/uL (ref 3.4–10.8)

## 2024-04-28 LAB — TSH: TSH: 1.33 u[IU]/mL (ref 0.450–4.500)

## 2024-04-28 LAB — LIPASE: Lipase: 29 U/L (ref 14–72)

## 2024-04-28 LAB — AMYLASE: Amylase: 73 U/L (ref 31–110)

## 2024-10-26 ENCOUNTER — Ambulatory Visit: Payer: Self-pay | Admitting: Nurse Practitioner
